# Patient Record
Sex: Male | Born: 1995
Health system: Southern US, Community
[De-identification: ages and names within clinical notes are randomized; demographics above are authoritative.]

---
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
## 2017-06-26 DIAGNOSIS — R509 Fever, unspecified: Secondary | ICD-10-CM | POA: Diagnosis not present

## 2017-06-26 DIAGNOSIS — R070 Pain in throat: Secondary | ICD-10-CM | POA: Diagnosis not present

## 2017-12-22 ENCOUNTER — Other Ambulatory Visit: Payer: Self-pay

## 2017-12-22 ENCOUNTER — Encounter (HOSPITAL_COMMUNITY): Payer: Self-pay

## 2017-12-22 ENCOUNTER — Ambulatory Visit (HOSPITAL_COMMUNITY)
Admission: EM | Admit: 2017-12-22 | Discharge: 2017-12-22 | Disposition: A | Discharge status: Home / Self Care | Payer: 59 | Attending: Physician Assistant | Admitting: Physician Assistant

## 2017-12-22 DIAGNOSIS — J0140 Acute pansinusitis, unspecified: Secondary | ICD-10-CM | POA: Diagnosis not present

## 2017-12-22 MED ORDER — AMOXICILLIN-POT CLAVULANATE 875-125 MG PO TABS: 1.0000 | ORAL_TABLET | Freq: Two times a day (BID) | ORAL | 0 refills | Status: DC

## 2017-12-22 MED ORDER — IPRATROPIUM BROMIDE 0.06 % NA SOLN: 2.0000 | Freq: Four times a day (QID) | NASAL | 0 refills | Status: AC

## 2017-12-22 MED ORDER — BENZONATATE 100 MG PO CAPS: 100.0000 mg | ORAL_CAPSULE | Freq: Three times a day (TID) | ORAL | 0 refills | Status: DC

## 2017-12-22 MED ORDER — FLUTICASONE PROPIONATE 50 MCG/ACT NA SUSP: 2.0000 | Freq: Every day | NASAL | 0 refills | Status: AC

## 2017-12-22 NOTE — ED Triage Notes (Signed)
Congestion , runny nose, cough and  fatigued  X 3 weeks

## 2017-12-22 NOTE — Discharge Instructions (Addendum)
Augmentin for sinus infection. Tessalon for cough. Start flonase, atrovent nasal spray for nasal congestion/drainage. You can use over the counter nasal saline rinse such as neti pot for nasal congestion. Keep hydrated, your urine should be clear to pale yellow in color. Tylenol/motrin for fever and pain. Monitor for any worsening of symptoms, chest pain, shortness of breath, wheezing, swelling of the throat, follow up for reevaluation.   For sore throat/cough try using a honey-based tea. Use 3 teaspoons of honey with juice squeezed from half lemon. Place shaved pieces of ginger into 1/2-1 cup of water and warm over stove top. Then mix the ingredients and repeat every 4 hours as needed.

## 2017-12-22 NOTE — ED Provider Notes (Signed)
[facility]    CSN: [id number] Arrival date & time: 12/22/17  1018     History   Chief Complaint Chief Complaint  Patient presents with  . Influenza    HPI Jon Greene  22 year old male comes in for 3 week history of URI symptoms. States had rhinorrhea, nasal congestion, cough that was worse the first 1.5 week. States slightly improved after 1.5 weeks but symptoms continued, and continued to feel fatigued. States first 1.5 weeks had alternating hot and cold chills without known fever, but this has since resolved. Still eating and drinking without difficulty. Denies chest pain, shortness of breath, wheezing. Never smoker. No sick contact. Was taking mucinex with good relief.      History reviewed. No pertinent past medical history.  There are no active problems to display for this patient.   History reviewed. No pertinent surgical history.     Home Medications    Prior to Admission medications   Medication Sig Start Date End Date Taking? Authorizing Provider  amoxicillin-clavulanate (AUGMENTIN) 875-125 MG tablet Take 1 tablet by mouth every 12 (twelve) hours. 12/22/17   Cathie HoopsYu, Amy V, PA-C  benzonatate (TESSALON) 100 MG capsule Take 1 capsule (100 mg total) by mouth every 8 (eight) hours. 12/22/17   Cathie HoopsYu, Amy V, PA-C  fluticasone (FLONASE) 50 MCG/ACT nasal spray Place 2 sprays into both nostrils daily. 12/22/17   Cathie HoopsYu, Amy V, PA-C  ipratropium (ATROVENT) 0.06 % nasal spray Place 2 sprays into both nostrils 4 (four) times daily. 12/22/17   Belinda FisherYu, Amy V, PA-C    Family History History reviewed. No pertinent family history.  Social History Social History   Tobacco Use  . Smoking status: Never Smoker  . Smokeless tobacco: Never Used  Substance Use Topics  . Alcohol use: Yes    Comment: occ  . Drug use: Never     Allergies   Patient has no known allergies.   Review of Systems Review of Systems  Reason unable to perform ROS: See HPI as above.     Physical  Exam Triage Vital Signs ED Triage Vitals  Enc Vitals Group     BP 12/22/17 1050 (!) 128/92     Pulse Rate 12/22/17 1050 98     Resp 12/22/17 1050 20     Temp 12/22/17 1050 98.8 F (37.1 C)     Temp Source 12/22/17 1050 Oral     SpO2 12/22/17 1050 100 %     Weight 12/22/17 1051 (!) 420 lb (190.5 kg)     Height --      Head Circumference --      Peak Flow --      Pain Score 12/22/17 1051 0     Pain Loc --      Pain Edu? --      Excl. in GC? --    No data found.  Updated Vital Signs BP (!) 128/92   Pulse 98   Temp 98.8 F (37.1 C) (Oral)   Resp 20   Wt (!) 420 lb (190.5 kg)   SpO2 100%   Physical Exam  Constitutional: He is oriented to person, place, and time. He appears well-developed and well-nourished. No distress.  HENT:  Head: Normocephalic and atraumatic.  Right Ear: External ear and ear canal normal. Tympanic membrane is erythematous. Tympanic membrane is not bulging.  Left Ear: External ear and ear canal normal. Tympanic membrane is erythematous. Tympanic membrane is not bulging.  Nose: Mucosal edema  and rhinorrhea present. Right sinus exhibits no maxillary sinus tenderness and no frontal sinus tenderness. Left sinus exhibits no maxillary sinus tenderness and no frontal sinus tenderness.  Mouth/Throat: Uvula is midline, oropharynx is clear and moist and mucous membranes are normal.  Eyes: Pupils are equal, round, and reactive to light. Conjunctivae are normal.  Neck: Normal range of motion. Neck supple.  Cardiovascular: Normal rate, regular rhythm and normal heart sounds. Exam reveals no gallop and no friction rub.  No murmur heard. Pulmonary/Chest: Effort normal and breath sounds normal. He has no decreased breath sounds. He has no wheezes. He has no rhonchi. He has no rales.  Lymphadenopathy:    He has no cervical adenopathy.  Neurological: He is alert and oriented to person, place, and time.  Skin: Skin is warm and dry.  Psychiatric: He has a normal mood and  affect. His behavior is normal. Judgment normal.   UC Treatments / Results  Labs (all labs ordered are listed, but only abnormal results are displayed) Labs Reviewed - No data to display  EKG None  Radiology No results found.  Procedures Procedures (including critical care time)  Medications Ordered in UC Medications - No data to display  Initial Impression / Assessment and Plan / UC Course  I have reviewed the triage vital signs and the nursing notes.  Pertinent labs & imaging results that were available during my care of the patient were reviewed by me and considered in my medical decision making (see chart for details).    Augmentin for sinusitis. Other symptomatic treatment discussed. Return precautions given. Patient expresses understanding and agrees to plan.  Final Clinical Impressions(s) / UC Diagnoses   Final diagnoses:  Acute non-recurrent pansinusitis    ED Prescriptions    Medication Sig Dispense Auth. Provider   amoxicillin-clavulanate (AUGMENTIN) 875-125 MG tablet Take 1 tablet by mouth every 12 (twelve) hours. 14 tablet Yu, Amy V, PA-C   fluticasone (FLONASE) 50 MCG/ACT nasal spray Place 2 sprays into both nostrils daily. 1 g Yu, Amy V, PA-C   ipratropium (ATROVENT) 0.06 % nasal spray Place 2 sprays into both nostrils 4 (four) times daily. 15 mL Yu, Amy V, PA-C   benzonatate (TESSALON) 100 MG capsule Take 1 capsule (100 mg total) by mouth every 8 (eight) hours. 21 capsule Threasa AlphaYu, Amy V, PA-C        Yu, Amy V, [place] 12/22/17 1127

## 2018-01-12 DIAGNOSIS — Z0000 Encounter for general adult medical examination without abnormal findings: Secondary | ICD-10-CM | POA: Diagnosis not present

## 2018-01-12 DIAGNOSIS — Z9109 Other allergy status, other than to drugs and biological substances: Secondary | ICD-10-CM | POA: Diagnosis not present

## 2018-01-12 DIAGNOSIS — R002 Palpitations: Secondary | ICD-10-CM | POA: Diagnosis not present

## 2018-01-27 ENCOUNTER — Encounter: Payer: Self-pay | Admitting: Skilled Nursing Facility1

## 2018-01-27 ENCOUNTER — Ambulatory Visit: Payer: 59 | Admitting: Skilled Nursing Facility1

## 2018-01-27 ENCOUNTER — Encounter: Payer: 59 | Attending: Family Medicine | Admitting: Skilled Nursing Facility1

## 2018-01-27 DIAGNOSIS — E669 Obesity, unspecified: Secondary | ICD-10-CM

## 2018-01-27 DIAGNOSIS — Z6841 Body Mass Index (BMI) 40.0 and over, adult: Secondary | ICD-10-CM | POA: Diagnosis not present

## 2018-01-27 DIAGNOSIS — Z713 Dietary counseling and surveillance: Secondary | ICD-10-CM | POA: Insufficient documentation

## 2018-01-27 NOTE — Progress Notes (Signed)
  Assessment:  Primary concerns today: obesity.   Pt states he wants to have an affordable way to eat well. Pt states he does not need to focus on his weight because he knows his proper lifestyle will result in weight loss. Pt is concerned with affording his food.  Pt states now he knows how to eat it is just a matter of doing it. Pt states his mom will help teach him how to cook.  Pt states he would like to come back for a follow up but has to check how much it would be.   MEDICATIONS: See List   DIETARY INTAKE:  Usual eating pattern includes 2 meals and 0 snacks per day.  Everyday foods include none stated.  Avoided foods include beef and pork.    24-hr recall:  B ( AM): skipped Snk ( AM):  L ( PM): fast food Snk ( PM):  D ( PM): baked or fried chicken or [place] burgers with french fries and mac n cheese Snk ( PM):  Beverages: water, soda, juice   Usual physical activity: ADL's  Estimated energy needs: 1500 calories 170 g carbohydrates 112 g protein 42 g fat  Progress Towards Goal(s):  In progress.    Intervention:  Nutrition cousneling. Dietitian educated the pt on weight management, healthy ways to cook, and how to grocery shop. Goals: -3 days a week walking from your house to the track, 1 lap at the track, then walking back to the house for 1 week: if that was tolerated increase to 2 laps for the next week including intentional arm movement and so on -Aim to eat at least 3 meals a day: eating every 3-5 hours  For a fast breakfast: fruit, or slice of bread with peanut butter, or fruit and nuts -Avoid fried foods and soda  -Do not cook with butter or gravy try oil instead; use chicken both instead of gravy for moist meats  -Create balanced meals including non starchy vegetables   Teaching Method Utilized:  Visual Auditory Hands on  Handouts given during visit include:  MyPlate  Meal Ideas   Barriers to learning/adherence to lifestyle change: none identified    Demonstrated degree of understanding via:  Teach Back   Monitoring/Evaluation:  Dietary intake, exercise, and body weight prn.

## 2018-01-27 NOTE — Patient Instructions (Addendum)
-  3 days a week walking from your house to the track, 1 lap at the track, then walking back to the house for 1 week: if that was tolerated increase to 2 laps for the next week including intentional arm movement and so on  -Aim to eat at least 3 meals a day: eating every 3-5 hours  For a fast breakfast: fruit, or slice of bread with peanut butter, or fruit and nuts  -Avoid fried foods and soda   -Do not cook with butter or gravy try oil instead; use chicken both instead of gravy for moist meats   -Create balanced meals including non starchy vegetables

## 2018-07-19 DIAGNOSIS — J069 Acute upper respiratory infection, unspecified: Secondary | ICD-10-CM | POA: Diagnosis not present

## 2018-07-30 DIAGNOSIS — Z9109 Other allergy status, other than to drugs and biological substances: Secondary | ICD-10-CM | POA: Diagnosis not present

## 2018-08-09 DIAGNOSIS — H1013 Acute atopic conjunctivitis, bilateral: Secondary | ICD-10-CM | POA: Diagnosis not present

## 2018-08-09 DIAGNOSIS — Z9109 Other allergy status, other than to drugs and biological substances: Secondary | ICD-10-CM | POA: Diagnosis not present

## 2018-08-09 DIAGNOSIS — J3089 Other allergic rhinitis: Secondary | ICD-10-CM | POA: Diagnosis not present

## 2020-08-09 ENCOUNTER — Ambulatory Visit
Admission: EM | Admit: 2020-08-09 | Discharge: 2020-08-09 | Disposition: A | Discharge status: Home / Self Care | Payer: 59

## 2020-08-09 ENCOUNTER — Other Ambulatory Visit: Payer: Self-pay

## 2020-08-09 DIAGNOSIS — J019 Acute sinusitis, unspecified: Secondary | ICD-10-CM

## 2020-08-09 MED ORDER — BENZONATATE 200 MG PO CAPS: 200.0000 mg | ORAL_CAPSULE | Freq: Three times a day (TID) | ORAL | 0 refills | Status: AC | PRN

## 2020-08-09 MED ORDER — PREDNISONE 20 MG PO TABS: 40.0000 mg | ORAL_TABLET | Freq: Every day | ORAL | 0 refills | Status: AC

## 2020-08-09 MED ORDER — AMOXICILLIN-POT CLAVULANATE 875-125 MG PO TABS: 1.0000 | ORAL_TABLET | Freq: Two times a day (BID) | ORAL | 0 refills | Status: AC

## 2020-08-09 NOTE — ED Triage Notes (Addendum)
Pt is here with a cough and congestion that started a week ago, pt has taken allergy meds to relieve discomfort. Pt does not want COVID testing today.

## 2020-08-09 NOTE — Discharge Instructions (Signed)
Begin Augmentin twice daily for 1 week Prednisone 40 mg daily for 5 days-take with food and earlier in the day if possible Continue allergy medicines Tessalon every 8 hours for cough May use over-the-counter Mucinex for further relief of congestion/mucus if needed Follow-up if not improving or worsening

## 2020-08-09 NOTE — ED Provider Notes (Signed)
[facility]    CSN: [id number] Arrival date & time: 08/09/20  0840      History   Chief Complaint Chief Complaint  Patient presents with  . Cough  . Nasal Congestion    HPI Jon Greene is a 25 y.o. male presenting today for evaluation of cough and congestion.  Reports symptoms began approximately 1 week ago.  Using allergy medicines without relief.  Reports continued mucus and cough.  Mucus thick and discolored.  Denies fevers.  HPI  History reviewed. No pertinent past medical history.  There are no problems to display for this patient.   History reviewed. No pertinent surgical history.     Home Medications    Prior to Admission medications   Medication Sig Start Date End Date Taking? Authorizing Provider  amoxicillin-clavulanate (AUGMENTIN) 875-125 MG tablet Take 1 tablet by mouth every 12 (twelve) hours for 7 days. 08/09/20 08/16/20 Yes Treyton Slimp C, PA-C  benzonatate (TESSALON) 200 MG capsule Take 1 capsule (200 mg total) by mouth 3 (three) times daily as needed for up to 7 days for cough. 08/09/20 08/16/20 Yes Saquoia Sianez C, PA-C  cyclobenzaprine (FLEXERIL) 10 MG tablet Take by mouth. 06/16/20  Yes [provider]  predniSONE (DELTASONE) 20 MG tablet Take 2 tablets (40 mg total) by mouth daily with breakfast for 5 days. 08/09/20 08/14/20 Yes Rhet Rorke C, PA-C  fluticasone (FLONASE) 50 MCG/ACT nasal spray Place 2 sprays into both nostrils daily. 12/22/17   Cathie Hoops, Amy V, PA-C  ipratropium (ATROVENT) 0.06 % nasal spray Place 2 sprays into both nostrils 4 (four) times daily. 12/22/17   Belinda Fisher, PA-C    Family History Family History  Problem Relation Age of Onset  . Healthy Mother   . Healthy Father     Social History Social History   Tobacco Use  . Smoking status: Never Smoker  . Smokeless tobacco: Never Used  Substance Use Topics  . Alcohol use: Yes  . Drug use: Never     Allergies   Patient has no known allergies.   Review  of Systems Review of Systems  Constitutional: Negative for activity change, appetite change, chills, fatigue and fever.  HENT: Positive for congestion, rhinorrhea, sore throat and voice change. Negative for ear pain, sinus pressure and trouble swallowing.   Eyes: Negative for discharge and redness.  Respiratory: Positive for cough. Negative for chest tightness and shortness of breath.   Cardiovascular: Negative for chest pain.  Gastrointestinal: Negative for abdominal pain, diarrhea, nausea and vomiting.  Musculoskeletal: Negative for myalgias.  Skin: Negative for rash.  Neurological: Negative for dizziness, light-headedness and headaches.     Physical Exam Triage Vital Signs ED Triage Vitals  Enc Vitals Group     BP 08/09/20 0853 (!) 134/93     Pulse Rate 08/09/20 0853 86     Resp 08/09/20 0853 20     Temp 08/09/20 0853 98.6 F (37 C)     Temp Source 08/09/20 0853 Oral     SpO2 08/09/20 0853 95 %     Weight --      Height --      Head Circumference --      Peak Flow --      Pain Score 08/09/20 0842 0     Pain Loc --      Pain Edu? --      Excl. in GC? --    No data found.  Updated Vital Signs BP (!) 134/93 (  BP Location: Left Arm)   Pulse 86   Temp 98.6 F (37 C) (Oral)   Resp 20   SpO2 95%   Visual Acuity Right Eye Distance:   Left Eye Distance:   Bilateral Distance:    Right Eye Near:   Left Eye Near:    Bilateral Near:     Physical Exam Vitals and nursing note reviewed.  Constitutional:      Appearance: He is well-developed.     Comments: No acute distress  HENT:     Head: Normocephalic and atraumatic.     Ears:     Comments: Bilateral ears without tenderness to palpation of external auricle, tragus and mastoid, EAC's without erythema or swelling, TM's with good bony landmarks and cone of light. Non erythematous.     Nose: Nose normal.     Mouth/Throat:     Comments: Oral mucosa pink and moist, no tonsillar enlargement or exudate. Posterior pharynx  patent and nonerythematous, no uvula deviation or swelling. Normal phonation. Eyes:     Conjunctiva/sclera: Conjunctivae normal.  Cardiovascular:     Rate and Rhythm: Normal rate and regular rhythm.  Pulmonary:     Effort: Pulmonary effort is normal. No respiratory distress.     Comments: Breathing comfortably at rest, CTABL, no wheezing, rales or other adventitious sounds auscultated Abdominal:     General: There is no distension.  Musculoskeletal:        General: Normal range of motion.     Cervical back: Neck supple.  Skin:    General: Skin is warm and dry.  Neurological:     Mental Status: He is alert and oriented to person, place, and time.      UC Treatments / Results  Labs (all labs ordered are listed, but only abnormal results are displayed) Labs Reviewed - No data to display  EKG   Radiology No results found.  Procedures Procedures (including critical care time)  Medications Ordered in UC Medications - No data to display  Initial Impression / Assessment and Plan / UC Course  I have reviewed the triage vital signs and the nursing notes.  Pertinent labs & imaging results that were available during my care of the patient were reviewed by me and considered in my medical decision making (see chart for details).     URI symptoms greater than 1 week-covering for sinusitis with Augmentin, continue allergy medicines, will trial prednisone also to add into further help with sinus inflammation.  Tessalon for cough.  Discussed strict return precautions. Patient verbalized understanding and is agreeable with plan.  Final Clinical Impressions(s) / UC Diagnoses   Final diagnoses:  Acute sinusitis with symptoms > 10 days     Discharge Instructions     Begin Augmentin twice daily for 1 week Prednisone 40 mg daily for 5 days-take with food and earlier in the day if possible Continue allergy medicines Tessalon every 8 hours for cough May use over-the-counter  Mucinex for further relief of congestion/mucus if needed Follow-up if not improving or worsening    ED Prescriptions    Medication Sig Dispense Auth. Provider   benzonatate (TESSALON) 200 MG capsule Take 1 capsule (200 mg total) by mouth 3 (three) times daily as needed for up to 7 days for cough. 28 capsule Montoya Watkin C, PA-C   amoxicillin-clavulanate (AUGMENTIN) 875-125 MG tablet Take 1 tablet by mouth every 12 (twelve) hours for 7 days. 14 tablet Lenzy Kerschner C, PA-C   predniSONE (DELTASONE) 20 MG tablet  Take 2 tablets (40 mg total) by mouth daily with breakfast for 5 days. 10 tablet Doristine Shehan, [place] C, PA-C     PDMP not reviewed this encounter.   Lew Dawes, [place] 08/09/20 [phone]

## 2021-07-04 ENCOUNTER — Encounter: Payer: Self-pay | Admitting: Emergency Medicine

## 2021-07-04 ENCOUNTER — Ambulatory Visit
Admission: EM | Admit: 2021-07-04 | Discharge: 2021-07-04 | Disposition: A | Discharge status: Home / Self Care | Payer: 59

## 2021-07-04 ENCOUNTER — Emergency Department (HOSPITAL_BASED_OUTPATIENT_CLINIC_OR_DEPARTMENT_OTHER)
Admission: EM | Admit: 2021-07-04 | Discharge: 2021-07-04 | Disposition: A | Discharge status: Home / Self Care | Payer: 59 | Attending: Emergency Medicine | Admitting: Emergency Medicine

## 2021-07-04 ENCOUNTER — Encounter (HOSPITAL_BASED_OUTPATIENT_CLINIC_OR_DEPARTMENT_OTHER): Payer: Self-pay

## 2021-07-04 ENCOUNTER — Other Ambulatory Visit: Payer: Self-pay

## 2021-07-04 DIAGNOSIS — M79605 Pain in left leg: Secondary | ICD-10-CM | POA: Diagnosis not present

## 2021-07-04 DIAGNOSIS — M79604 Pain in right leg: Secondary | ICD-10-CM | POA: Diagnosis not present

## 2021-07-04 DIAGNOSIS — M25561 Pain in right knee: Secondary | ICD-10-CM | POA: Diagnosis present

## 2021-07-04 DIAGNOSIS — M25562 Pain in left knee: Secondary | ICD-10-CM | POA: Insufficient documentation

## 2021-07-04 DIAGNOSIS — R202 Paresthesia of skin: Secondary | ICD-10-CM

## 2021-07-04 LAB — CBC WITH DIFFERENTIAL/PLATELET
Abs Immature Granulocytes: 0.02 10*3/uL (ref 0.00–0.07)
Basophils Absolute: 0.1 10*3/uL (ref 0.0–0.1)
Basophils Relative: 1 %
Eosinophils Absolute: 0.2 10*3/uL (ref 0.0–0.5)
Eosinophils Relative: 3 %
HCT: 48.2 % (ref 39.0–52.0)
Hemoglobin: 15.1 g/dL (ref 13.0–17.0)
Immature Granulocytes: 0 %
Lymphocytes Relative: 44 %
Lymphs Abs: 3.2 10*3/uL (ref 0.7–4.0)
MCH: 26.4 pg (ref 26.0–34.0)
MCHC: 31.3 g/dL (ref 30.0–36.0)
MCV: 84.3 fL (ref 80.0–100.0)
Monocytes Absolute: 0.8 10*3/uL (ref 0.1–1.0)
Monocytes Relative: 11 %
Neutro Abs: 3 10*3/uL (ref 1.7–7.7)
Neutrophils Relative %: 41 %
Platelets: 371 10*3/uL (ref 150–400)
RBC: 5.72 MIL/uL (ref 4.22–5.81)
RDW: 12.9 % (ref 11.5–15.5)
WBC: 7.4 10*3/uL (ref 4.0–10.5)
nRBC: 0 % (ref 0.0–0.2)

## 2021-07-04 LAB — BASIC METABOLIC PANEL
Anion gap: 6 (ref 5–15)
BUN: 9 mg/dL (ref 6–20)
CO2: 26 mmol/L (ref 22–32)
Calcium: 9.5 mg/dL (ref 8.9–10.3)
Chloride: 107 mmol/L (ref 98–111)
Creatinine, Ser: 0.99 mg/dL (ref 0.61–1.24)
GFR, Estimated: >60 mL/min (ref >60)
Glucose, Bld: 84 mg/dL (ref 70–99)
Potassium: 3.9 mmol/L (ref 3.5–5.1)
Sodium: 139 mmol/L (ref 135–145)

## 2021-07-04 LAB — D-DIMER, QUANTITATIVE: D-Dimer, Quant: <0.27 ug/mL-FEU (ref 0.00–0.50)

## 2021-07-04 MED ORDER — MELOXICAM 15 MG PO TABS: 15.0000 mg | ORAL_TABLET | Freq: Every day | ORAL | 0 refills | Status: AC

## 2021-07-04 NOTE — ED Triage Notes (Addendum)
Patient here POV from UC for Bilateral Leg Pain.  ? ?Endorses Pain began approximately 6 days PTA as Warmness to Right Leg initially and then the Left Leg as well. Pain is to Anterior Knees and radiates to Lower Legs.  ? ?Also endorses Tingling to Lower Jaw intermittently for a Year. Sent by UC due to Tingling for Evaluation. ? ?BEFAST Exam Unremarkable. No Fevers. No N/V/D.  ? ?NAD Noted during Triage. A&Ox4. GCS 15. Ambulatory. ?

## 2021-07-04 NOTE — ED Triage Notes (Signed)
Noticed bilateral lower leg warmth and hand warmth starting last week. Then noticed some new blue veins in his feet. C/o bilateral knee pain, patient also morbidly obese. Denies SOB, cough, prolonged periods of sitting, clotting disorders ?

## 2021-07-04 NOTE — Discharge Instructions (Signed)
Please go to the emergency department as soon as you leave urgent care for further evaluation and management. ?

## 2021-07-04 NOTE — ED Provider Notes (Signed)
?[facility] ?Provider Note ? ? ?CSN: [id number] ?Arrival date & time: 07/04/21  1405 ? ?  ? ?History ? ?Chief Complaint  ?Patient presents with  ? Leg Pain  ? Tingling  ? ? ?Jon Greene is a 26 y.o. male. ? ?Pt complains of pain in both knees.  Pt reports blood vessels in his feet look bluer than usual.  Pt went to urgent care.  Pt reports he was told to come here for evaltuion.  Pt reports he also has tingling to his chin on and off for 1 year.  No current tingling or numbness ? ?The history is provided by the patient. No language interpreter was used.  ?Leg Pain ?Location:  Knee ?Time since incident:  1 week ?Injury: no   ?Knee location:  L knee and R knee ?Pain details:  ?  Quality:  Aching ?  Radiates to:  Does not radiate ?  Severity:  Moderate ?  Onset quality:  Gradual ?  Timing:  Constant ?  Progression:  Worsening ?Chronicity:  New ?Foreign body present:  No foreign bodies ?Prior injury to area:  No ?Relieved by:  Nothing ?Worsened by:  Nothing ?Ineffective treatments:  None tried ?Associated symptoms: no back pain   ?Risk factors: obesity   ?Risk factors: no concern for non-accidental trauma   ? ?  ? ?Home Medications ?Prior to Admission medications   ?Medication Sig Start Date End Date Taking? Authorizing Provider  ?cetirizine (ZYRTEC) 10 MG tablet Take 10 mg by mouth daily.   Yes [provider]  ?fluticasone (FLONASE) 50 MCG/ACT nasal spray Place 2 sprays into both nostrils daily. 12/22/17  Yes Yu, Amy V, PA-C  ?meloxicam (MOBIC) 15 MG tablet Take 1 tablet (15 mg total) by mouth daily. 07/04/21 07/04/22 Yes Cheron Schaumann K, PA-C  ?ipratropium (ATROVENT) 0.06 % nasal spray Place 2 sprays into both nostrils 4 (four) times daily. ?Patient not taking: Reported on 07/04/2021 12/22/17   Belinda Fisher, PA-C  ?   ? ?Allergies    ?Patient has no known allergies.   ? ?Review of Systems   ?Review of Systems  ?Musculoskeletal:  Negative for back pain.  ?All other systems reviewed and are  negative. ? ?Physical Exam ?Updated Vital Signs ?BP 123/72 (BP Location: Right Arm)   Pulse 84   Temp 97.8 ?F (36.6 ?C)   Resp 16   Ht 6' (1.829 m)   Wt (!) 175.5 kg   SpO2 97%   BMI 52.49 kg/m?  ?Physical Exam ?Vitals and nursing note reviewed.  ?Constitutional:   ?   Appearance: He is well-developed.  ?HENT:  ?   Head: Normocephalic.  ?   Mouth/Throat:  ?   Mouth: Mucous membranes are moist.  ?Cardiovascular:  ?   Rate and Rhythm: Normal rate.  ?Pulmonary:  ?   Effort: Pulmonary effort is normal.  ?Abdominal:  ?   General: There is no distension.  ?Musculoskeletal:     ?   General: Tenderness present. Normal range of motion.  ?   Cervical back: Normal range of motion.  ?   Comments: Tender bilat knees,  negative homan's.    ?Skin: ?   General: Skin is warm.  ?Neurological:  ?   General: No focal deficit present.  ?   Mental Status: He is alert and oriented to person, place, and time.  ?Psychiatric:     ?   Mood and Affect: Mood normal.  ? ? ?ED Results /  Procedures / Treatments   ?Labs ?(all labs ordered are listed, but only abnormal results are displayed) ?Labs Reviewed  ?CBC WITH DIFFERENTIAL/PLATELET  ?BASIC METABOLIC PANEL  ?D-DIMER, QUANTITATIVE (NOT AT [facility])  ? ? ?EKG ?None ? ?Radiology ?No results found. ? ?Procedures ?Procedures  ? ? ?Medications Ordered in ED ?Medications - No data to display ? ?ED Course/ Medical Decision Making/ A&P ?  ?                        ?Medical Decision Making ?Pt concerned he could have blood clots.  Pt reports he has pain in his legs and was reading about clots on line ? ?Problems Addressed: ?Acute pain of right knee: acute illness or injury ? ?Amount and/or Complexity of Data Reviewed ?External Data Reviewed: notes. ?   Details: Urgent care notes reviewed ?Labs: ordered. Decision-making details documented in ED Course. ?   Details: Labs ordered reviewed and interpreted,  ddimer is negative,  glucose is normal ? ?Risk ?Prescription drug management. ?Risk Details: I  doubt dvt.  No concern for pe.  Ddimer is negative and this is reassuring.  Pt's symptoms most consistent with muscle pain.  The numbness pt has in his face comes and goes and is not present currently.  Numbness is midline when is occurs.  ? ? ? ? ? ? ? ? ? ? ?Final Clinical Impression(s) / ED Diagnoses ?Final diagnoses:  ?Acute pain of right knee  ?Left leg pain  ? ? ?Rx / DC Orders ?ED Discharge Orders   ? ?      Ordered  ?  meloxicam (MOBIC) 15 MG tablet  Daily       ? 07/04/21 1830  ? ?  ?  ? ?  ? ?An After Visit Summary was printed and given to the patient.  ?  ?Elson Areas, PA-C ?07/04/21 2147 ? ?  ?Gwyneth Sprout, MD ?07/06/21 0022 ? ?

## 2021-07-04 NOTE — ED Provider Notes (Signed)
?[facility] ? ? ? ?CSN: [id number] ?Arrival date & time: 07/04/21  1043 ? ? ?  ? ?History   ?Chief Complaint ?Chief Complaint  ?Patient presents with  ? Leg Pain  ? ? ?HPI ?Jon Greene is a 26 y.o. male.  ? ?Patient presents with bilateral leg pain that has been present for approximately 1 week.  Denies any apparent injury.  Denies history of any chronic leg pain.  Patient reports that the pain occurs in the anterior knee and has a "burning and sharp pain".  Denies any numbness or tingling.  Denies headache, chest pain, shortness of breath, dizziness, blurred vision, nausea, vomiting.  Patient does report that he has been having some intermittent left-sided facial tingling that has been present over the past few months.  Last episode was yesterday.  Denies any pertinent medical history including cardiac problems.  Patient only takes allergy medication. ? ? ?Leg Pain ? ?History reviewed. No pertinent past medical history. ? ?There are no problems to display for this patient. ? ? ?History reviewed. No pertinent surgical history. ? ? ? ? ?Home Medications   ? ?Prior to Admission medications   ?Medication Sig Start Date End Date Taking? Authorizing Provider  ?cyclobenzaprine (FLEXERIL) 10 MG tablet Take by mouth. 06/16/20   [provider]  ?fluticasone (FLONASE) 50 MCG/ACT nasal spray Place 2 sprays into both nostrils daily. 12/22/17   Cathie Hoops, Amy V, PA-C  ?ipratropium (ATROVENT) 0.06 % nasal spray Place 2 sprays into both nostrils 4 (four) times daily. 12/22/17   Belinda Fisher, PA-C  ? ? ?Family History ?Family History  ?Problem Relation Age of Onset  ? Healthy Mother   ? Healthy Father   ? ? ?Social History ?Social History  ? ?Tobacco Use  ? Smoking status: Never  ? Smokeless tobacco: Never  ?Substance Use Topics  ? Alcohol use: Yes  ? Drug use: Never  ? ? ? ?Allergies   ?Patient has no known allergies. ? ? ?Review of Systems ?Review of Systems ?Per HPI ? ?Physical Exam ?Triage Vital Signs ?ED Triage  Vitals  ?Enc Vitals Group  ?   BP 07/04/21 1050 138/81  ?   Pulse Rate 07/04/21 1050 79  ?   Resp 07/04/21 1050 16  ?   Temp 07/04/21 1050 98.3 ?F (36.8 ?C)  ?   Temp Source 07/04/21 1050 Oral  ?   SpO2 07/04/21 1050 96 %  ?   Weight --   ?   Height --   ?   Head Circumference --   ?   Peak Flow --   ?   Pain Score 07/04/21 1052 1  ?   Pain Loc --   ?   Pain Edu? --   ?   Excl. in GC? --   ? ?No data found. ? ?Updated Vital Signs ?BP 138/81 (BP Location: Left Arm)   Pulse 79   Temp 98.3 ?F (36.8 ?C) (Oral)   Resp 16   SpO2 96%  ? ?Visual Acuity ?Right Eye Distance:   ?Left Eye Distance:   ?Bilateral Distance:   ? ?Right Eye Near:   ?Left Eye Near:    ?Bilateral Near:    ? ?Physical Exam ?Constitutional:   ?   General: He is not in acute distress. ?   Appearance: Normal appearance. He is not toxic-appearing or diaphoretic.  ?HENT:  ?   Head: Normocephalic and atraumatic.  ?Eyes:  ?   Extraocular Movements: Extraocular movements  intact.  ?   Conjunctiva/sclera: Conjunctivae normal.  ?   Pupils: Pupils are equal, round, and reactive to light.  ?Cardiovascular:  ?   Rate and Rhythm: Normal rate and regular rhythm.  ?   Pulses: Normal pulses.  ?   Heart sounds: Normal heart sounds.  ?Pulmonary:  ?   Effort: Pulmonary effort is normal. No respiratory distress.  ?   Breath sounds: Normal breath sounds.  ?Musculoskeletal:  ?   Right knee: No swelling, bony tenderness or crepitus. No tenderness. No LCL laxity, MCL laxity, ACL laxity or PCL laxity. Normal alignment, normal meniscus and normal patellar mobility. Normal pulse.  ?   Left knee: No swelling, bony tenderness or crepitus. No tenderness. No LCL laxity, MCL laxity, ACL laxity or PCL laxity.Normal alignment, normal meniscus and normal patellar mobility. Normal pulse.  ?   Comments: No tenderness to palpation throughout knee or bilateral lower extremity.  Patient has full range of motion of knee, ankle, feet.  Neurovascular intact.  No erythema, bruising,  lacerations, abrasions noted.  ?Skin: ?   General: Skin is warm and dry.  ?Neurological:  ?   General: No focal deficit present.  ?   Mental Status: He is alert and oriented to person, place, and time. Mental status is at baseline.  ?   Cranial Nerves: Cranial nerves 2-12 are intact.  ?   Sensory: Sensation is intact.  ?   Motor: Motor function is intact.  ?   Coordination: Coordination is intact.  ?   Gait: Gait is intact.  ?Psychiatric:     ?   Mood and Affect: Mood normal.     ?   Behavior: Behavior normal.     ?   Thought Content: Thought content normal.     ?   Judgment: Judgment normal.  ? ? ? ?UC Treatments / Results  ?Labs ?(all labs ordered are listed, but only abnormal results are displayed) ?Labs Reviewed - No data to display ? ?EKG ? ? ?Radiology ?No results found. ? ?Procedures ?Procedures (including critical care time) ? ?Medications Ordered in UC ?Medications - No data to display ? ?Initial Impression / Assessment and Plan / UC Course  ?I have reviewed the triage vital signs and the nursing notes. ? ?Pertinent labs & imaging results that were available during my care of the patient were reviewed by me and considered in my medical decision making (see chart for details). ? ?  ? ?Suspect the patient could have bilateral knee arthritis that is causing his symptoms.  No suspicion for DVT.  Although, patient does report some facial tingling that was present yesterday, therefore I do think the patient needs a more extensive evaluation than can provided at the urgent care to rule out worrisome etiologies.  Patient was advised to go to the ER for further evaluation and management.  Patient was agreeable with plan.  Vital signs stable at discharge and neuro exam was normal, and patient does not have any current facial tingling or neurological symptoms.  Agree with patient self transport to the hospital. ?Final Clinical Impressions(s) / UC Diagnoses  ? ?Final diagnoses:  ?Facial paresthesia  ?Bilateral leg pain   ? ? ? ?Discharge Instructions   ? ?  ?Please go to the emergency department as soon as you leave urgent care for further evaluation and management. ? ? ? ?ED Prescriptions   ?None ?  ? ?PDMP not reviewed this encounter. ?  ?Gustavus Bryant, [place] ?07/04/21 1111 ? ?
